# Patient Record
Sex: Male | Born: 2000 | Race: White | Hispanic: No | Marital: Single | Smoking: Never smoker
Health system: Southern US, Community
[De-identification: ages and names within clinical notes are randomized; demographics above are authoritative.]

## PROBLEM LIST (undated history)

## (undated) DIAGNOSIS — G43909 Migraine, unspecified, not intractable, without status migrainosus: Secondary | ICD-10-CM

## (undated) DIAGNOSIS — R12 Heartburn: Secondary | ICD-10-CM

## (undated) DIAGNOSIS — Z969 Presence of functional implant, unspecified: Secondary | ICD-10-CM

## (undated) DIAGNOSIS — Z87442 Personal history of urinary calculi: Secondary | ICD-10-CM

## (undated) DIAGNOSIS — S5290XA Unspecified fracture of unspecified forearm, initial encounter for closed fracture: Secondary | ICD-10-CM

## (undated) HISTORY — PX: TONSILLECTOMY AND ADENOIDECTOMY: SHX28

---
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
## 2020-09-13 DIAGNOSIS — F909 Attention-deficit hyperactivity disorder, unspecified type: Secondary | ICD-10-CM | POA: Diagnosis not present

## 2020-10-16 DIAGNOSIS — Z20828 Contact with and (suspected) exposure to other viral communicable diseases: Secondary | ICD-10-CM | POA: Diagnosis not present

## 2021-01-15 DIAGNOSIS — K219 Gastro-esophageal reflux disease without esophagitis: Secondary | ICD-10-CM | POA: Diagnosis not present

## 2021-01-15 DIAGNOSIS — F909 Attention-deficit hyperactivity disorder, unspecified type: Secondary | ICD-10-CM | POA: Diagnosis not present

## 2021-01-15 DIAGNOSIS — Z0000 Encounter for general adult medical examination without abnormal findings: Secondary | ICD-10-CM | POA: Diagnosis not present

## 2021-01-15 DIAGNOSIS — Z6825 Body mass index (BMI) 25.0-25.9, adult: Secondary | ICD-10-CM | POA: Diagnosis not present

## 2021-04-09 DIAGNOSIS — R0981 Nasal congestion: Secondary | ICD-10-CM | POA: Diagnosis not present

## 2021-04-09 DIAGNOSIS — R051 Acute cough: Secondary | ICD-10-CM | POA: Diagnosis not present

## 2021-11-05 DIAGNOSIS — K219 Gastro-esophageal reflux disease without esophagitis: Secondary | ICD-10-CM | POA: Diagnosis not present

## 2021-11-05 DIAGNOSIS — F909 Attention-deficit hyperactivity disorder, unspecified type: Secondary | ICD-10-CM | POA: Diagnosis not present

## 2022-01-29 DIAGNOSIS — Z1331 Encounter for screening for depression: Secondary | ICD-10-CM | POA: Diagnosis not present

## 2022-01-29 DIAGNOSIS — Z0000 Encounter for general adult medical examination without abnormal findings: Secondary | ICD-10-CM | POA: Diagnosis not present

## 2022-01-29 DIAGNOSIS — Z6825 Body mass index (BMI) 25.0-25.9, adult: Secondary | ICD-10-CM | POA: Diagnosis not present

## 2022-01-29 DIAGNOSIS — Z111 Encounter for screening for respiratory tuberculosis: Secondary | ICD-10-CM | POA: Diagnosis not present

## 2022-01-30 DIAGNOSIS — D2239 Melanocytic nevi of other parts of face: Secondary | ICD-10-CM | POA: Diagnosis not present

## 2022-01-30 DIAGNOSIS — D225 Melanocytic nevi of trunk: Secondary | ICD-10-CM | POA: Diagnosis not present

## 2022-01-30 DIAGNOSIS — D485 Neoplasm of uncertain behavior of skin: Secondary | ICD-10-CM | POA: Diagnosis not present

## 2022-01-30 DIAGNOSIS — L814 Other melanin hyperpigmentation: Secondary | ICD-10-CM | POA: Diagnosis not present

## 2022-07-02 DIAGNOSIS — Z23 Encounter for immunization: Secondary | ICD-10-CM | POA: Diagnosis not present

## 2022-07-02 DIAGNOSIS — K219 Gastro-esophageal reflux disease without esophagitis: Secondary | ICD-10-CM | POA: Diagnosis not present

## 2022-07-02 DIAGNOSIS — F909 Attention-deficit hyperactivity disorder, unspecified type: Secondary | ICD-10-CM | POA: Diagnosis not present

## 2022-12-05 DIAGNOSIS — F909 Attention-deficit hyperactivity disorder, unspecified type: Secondary | ICD-10-CM | POA: Diagnosis not present

## 2022-12-17 DIAGNOSIS — M79672 Pain in left foot: Secondary | ICD-10-CM | POA: Diagnosis not present

## 2023-02-03 DIAGNOSIS — Z1331 Encounter for screening for depression: Secondary | ICD-10-CM | POA: Diagnosis not present

## 2023-02-03 DIAGNOSIS — Z713 Dietary counseling and surveillance: Secondary | ICD-10-CM | POA: Diagnosis not present

## 2023-02-03 DIAGNOSIS — Z6826 Body mass index (BMI) 26.0-26.9, adult: Secondary | ICD-10-CM | POA: Diagnosis not present

## 2023-02-03 DIAGNOSIS — Z0000 Encounter for general adult medical examination without abnormal findings: Secondary | ICD-10-CM | POA: Diagnosis not present

## 2024-02-26 DIAGNOSIS — F909 Attention-deficit hyperactivity disorder, unspecified type: Secondary | ICD-10-CM | POA: Diagnosis not present

## 2024-02-26 DIAGNOSIS — Z6826 Body mass index (BMI) 26.0-26.9, adult: Secondary | ICD-10-CM | POA: Diagnosis not present

## 2024-02-26 DIAGNOSIS — K219 Gastro-esophageal reflux disease without esophagitis: Secondary | ICD-10-CM | POA: Diagnosis not present

## 2024-02-26 DIAGNOSIS — G43001 Migraine without aura, not intractable, with status migrainosus: Secondary | ICD-10-CM | POA: Diagnosis not present

## 2024-04-20 DIAGNOSIS — Z6826 Body mass index (BMI) 26.0-26.9, adult: Secondary | ICD-10-CM | POA: Diagnosis not present

## 2024-04-20 DIAGNOSIS — Z1389 Encounter for screening for other disorder: Secondary | ICD-10-CM | POA: Diagnosis not present

## 2024-04-20 DIAGNOSIS — Z0000 Encounter for general adult medical examination without abnormal findings: Secondary | ICD-10-CM | POA: Diagnosis not present

## 2024-04-20 DIAGNOSIS — Z1331 Encounter for screening for depression: Secondary | ICD-10-CM | POA: Diagnosis not present
